# Patient Record
Sex: Female | Born: 1995 | Hispanic: No | Marital: Single
Health system: Northeastern US, Academic
[De-identification: ages and names within clinical notes are randomized; demographics above are authoritative.]

---
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
## 2022-08-27 ENCOUNTER — Ambulatory Visit: Admit: 2022-08-27 | Payer: MEDICAID | Attending: Dermatology | Primary: Internal Medicine

## 2022-08-27 DIAGNOSIS — L219 Seborrheic dermatitis, unspecified: Secondary | ICD-10-CM

## 2022-08-27 MED ORDER — HYDROCORTISONE 2.5 % TOPICAL CREAM
2.5 % | Freq: Two times a day (BID) | TOPICAL | 1 refills | Status: AC
Start: 2022-08-27 — End: ?

## 2022-08-27 MED ORDER — CLINDAMYCIN PHOSPHATE 1 % TOPICAL SWAB
1 % | Freq: Two times a day (BID) | TOPICAL | 5 refills | Status: AC
Start: 2022-08-27 — End: ?

## 2022-08-27 MED ORDER — KETOCONAZOLE 2 % TOPICAL CREAM
2 % | Freq: Every day | TOPICAL | 3 refills | Status: AC
Start: 2022-08-27 — End: ?

## 2022-08-27 NOTE — Progress Notes
She c/o lesions on the left buttock that she treated with aldara in the past. She has 5 white scars there now. She will use silicone scar gel.She c/o a dry rash on her face. 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 She will use Hydrocortisone 2.5% cream bid for 5 days then ketoconzaole cream to prevent. For acne on the thighs whe will use clindamycin wipes

## 2022-09-04 ENCOUNTER — Inpatient Hospital Stay: Admit: 2022-09-04 | Discharge: 2022-09-04 | Payer: MEDICAID | Attending: Emergency Medicine

## 2022-09-04 ENCOUNTER — Emergency Department: Admit: 2022-09-04 | Payer: MEDICAID | Primary: Internal Medicine

## 2022-09-04 DIAGNOSIS — R112 Nausea with vomiting, unspecified: Secondary | ICD-10-CM

## 2022-09-04 DIAGNOSIS — R1030 Lower abdominal pain, unspecified: Secondary | ICD-10-CM

## 2022-09-04 LAB — URINALYSIS WITH CULTURE REFLEX      (BH LMW YH)
BKR BILIRUBIN, UA: NEGATIVE
BKR GLUCOSE, UA: NEGATIVE
BKR KETONES, UA: NEGATIVE
BKR NITRITE, UA: NEGATIVE
BKR PH, UA: 6.5 (ref 5.5–7.5)
BKR SPECIFIC GRAVITY, UA: 1.021 (ref 1.005–1.030)
BKR UROBILINOGEN, UA (MG/DL): <2 mg/dL (ref <=2.0)

## 2022-09-04 LAB — COMPREHENSIVE METABOLIC PANEL
BKR A/G RATIO: 1.5
BKR ALANINE AMINOTRANSFERASE (ALT): 21 U/L (ref 12–78)
BKR ALBUMIN: 4.9 g/dL (ref 3.4–5.0)
BKR ALKALINE PHOSPHATASE: 83 U/L (ref 20–120)
BKR ANION GAP: 7 (ref 5–18)
BKR ASPARTATE AMINOTRANSFERASE (AST): 10 U/L (ref 5–37)
BKR AST/ALT RATIO: 0.5
BKR BILIRUBIN TOTAL: 0.8 mg/dL (ref 0.0–1.0)
BKR BLOOD UREA NITROGEN: 7 mg/dL — ABNORMAL LOW (ref 8–25)
BKR BUN / CREAT RATIO: 9.2 (ref 8.0–25.0)
BKR CALCIUM: 9.7 mg/dL (ref 8.4–10.3)
BKR CHLORIDE: 109 mmol/L (ref 95–115)
BKR CO2: 24 mmol/L (ref 21–32)
BKR CREATININE: 0.76 mg/dL (ref 0.50–1.30)
BKR EGFR, CREATININE (CKD-EPI 2021): >60 mL/min/{1.73_m2} (ref >=60)
BKR GLOBULIN: 3.3 g/dL
BKR GLUCOSE: 87 mg/dL (ref 70–100)
BKR OSMOLALITY CALCULATION: 277 mosm/kg (ref 275–295)
BKR POTASSIUM: 4.1 mmol/L (ref 3.5–5.1)
BKR PROTEIN TOTAL: 8.2 g/dL (ref 6.4–8.2)
BKR SODIUM: 140 mmol/L (ref 136–145)

## 2022-09-04 LAB — CBC WITH AUTO DIFFERENTIAL
BKR WAM ABSOLUTE IMMATURE GRANULOCYTES.: 0.04 x 1000/ÂµL (ref 0.00–0.30)
BKR WAM ABSOLUTE LYMPHOCYTE COUNT.: 4.79 x 1000/ÂµL — ABNORMAL HIGH (ref 0.60–3.70)
BKR WAM ABSOLUTE NRBC (2 DEC): 0 x 1000/ÂµL (ref 0.00–1.00)
BKR WAM ANALYZER ANC: 7.34 x 1000/ÂµL (ref 2.00–7.60)
BKR WAM BASOPHIL ABSOLUTE COUNT.: 0.03 x 1000/ÂµL (ref 0.00–1.00)
BKR WAM BASOPHILS: 0.2 % (ref 0.0–1.4)
BKR WAM EOSINOPHIL ABSOLUTE COUNT.: 0.31 x 1000/ÂµL (ref 0.00–1.00)
BKR WAM EOSINOPHILS: 2.3 % (ref 0.0–5.0)
BKR WAM HEMATOCRIT (2 DEC): 42.2 % (ref 35.00–45.00)
BKR WAM HEMOGLOBIN: 14.4 g/dL (ref 11.7–15.5)
BKR WAM IMMATURE GRANULOCYTES: 0.3 % (ref 0.0–1.0)
BKR WAM LYMPHOCYTES: 36.1 % (ref 17.0–50.0)
BKR WAM MCH (PG): 31.2 pg (ref 27.0–33.0)
BKR WAM MCHC: 34.1 g/dL (ref 31.0–36.0)
BKR WAM MCV: 91.3 fL (ref 80.0–100.0)
BKR WAM MONOCYTE ABSOLUTE COUNT.: 0.76 x 1000/ÂµL (ref 0.00–1.00)
BKR WAM MONOCYTES: 5.7 % (ref 4.0–12.0)
BKR WAM MPV: 11.2 fL (ref 8.0–12.0)
BKR WAM NEUTROPHILS: 55.4 % (ref 39.0–72.0)
BKR WAM NUCLEATED RED BLOOD CELLS: 0 % (ref 0.0–1.0)
BKR WAM PLATELETS: 286 x1000/ÂµL (ref 150–420)
BKR WAM RDW-CV: 11.9 % (ref 11.0–15.0)
BKR WAM RED BLOOD CELL COUNT.: 4.62 M/ÂµL (ref 4.00–6.00)
BKR WAM WHITE BLOOD CELL COUNT: 13.3 x1000/ÂµL — ABNORMAL HIGH (ref 4.0–11.0)

## 2022-09-04 LAB — UA REFLEX CULTURE

## 2022-09-04 LAB — URINE MICROSCOPIC     (BH GH LMW YH)
BKR RBC/HPF INSTRUMENT: 6 /HPF — ABNORMAL HIGH (ref 0–2)
BKR URINE SQUAMOUS EPITHELIAL CELLS, UA (NUMERIC): 6 /HPF — ABNORMAL HIGH (ref 0–5)
BKR WBC/HPF INSTRUMENT: 6 /HPF — ABNORMAL HIGH (ref 0–5)

## 2022-09-04 LAB — HCG, SERUM, QUALITATIVE (BH GH L LMW): BKR SERUM PREGNANCY-QUALITATIVE: NEGATIVE

## 2022-09-04 LAB — LIPASE: BKR LIPASE: 106 U/L (ref 73–393)

## 2022-09-04 MED ORDER — KETOROLAC 30 MG/ML (1 ML) INJECTION SOLUTION
30 mg/mL (1 mL) | Freq: Once | INTRAVENOUS | Status: CP
Start: 2022-09-04 — End: ?
  Administered 2022-09-04: 21:00:00 30 mL via INTRAVENOUS

## 2022-09-04 MED ORDER — IOHEXOL 350 MG IODINE/ML INTRAVENOUS SOLUTION
350 mg iodine/mL | Freq: Once | INTRAVENOUS | Status: CP | PRN
Start: 2022-09-04 — End: ?
  Administered 2022-09-04: 350 mL via INTRAVENOUS

## 2022-09-04 MED ORDER — OXYCODONE IMMEDIATE RELEASE 5 MG TABLET
5 mg | Freq: Once | ORAL | Status: DC
Start: 2022-09-04 — End: 2022-09-05

## 2022-09-04 MED ORDER — OXYCODONE IMMEDIATE RELEASE 5 MG TABLET
5 mg | ORAL_TABLET | Freq: Three times a day (TID) | ORAL | 1 refills | Status: AC | PRN
Start: 2022-09-04 — End: ?

## 2022-09-04 NOTE — ED Notes
5:19 PM  - Oral contrast start time 1700

## 2022-09-05 DIAGNOSIS — D259 Leiomyoma of uterus, unspecified: Secondary | ICD-10-CM

## 2022-09-05 NOTE — ED Provider Notes
? 2014 CD-Notes ?  All Rights [facility] Emergency Department	Chief Complaint:Chief Complaint Patient presents with ? Abdominal Pain   BIBA from work. +hx of fibroids. Seen at [facility] Monday and given motrin. Today c/o abd pain. +1 X EMESIS NGE:[id number] Hannah Butler, 26 y.o., female, complains of abdominal pain.  The patient c/o suprapubic pain x 3 days PTA.  Pain is dull, moderate in severity and Associated with N/V.  She reports having same kind of pain for the past 4 years usually coinciding with her menses.  She says that she was seen [facility] 3 days ago for these same symptoms and diagnosed with uterine fibroids after pelvic ultrasound, given Motrin and discharged. She denies fever, chills, diarrhea, dysuria.  + menses currently.  Pain started three days agoThe pain is described as dullSeverity of the pain is moderate.Onset of pain was gradual.Pain is located in the suprapubicAggravating factors: unable to associate with any factorAssociated symptoms: N/V x 1Historian: patientReview of Systems:Constitution: 	Negative for fever, chills, weaknessENT:		Negative for sore throat, nasal congestionEYES:		Negative for vision changesCARDIO:	Negative for chest pain, palpitationsRESP:		Negative for cough, shortness of breathGI:		abdominal pain, nausea, vomiting, Denies: flank pain, diarrhea, black stool, blood in stoolGU:		vaginal bleeding, Denies: dysuria, frequency/urgency, hematuriaMUSC:		Negative for muscle, joint painSKIN:		Negative for rashNEURO:	Negative for headachePast Medical History:No past medical history on file.No past surgical history on file.Social History: Family History:No family history on file.Medications: Medication List  START taking these medications  oxyCODONE 5 mg Immediate Release tabletCommonly known as: ROXICODONETake 1 tablet (5 mg total) by mouth every 8 (eight) hours as needed for pain.  ASK your doctor about these medications  clindamycin 1 % SwabCommonly known as: Clindacin PApply topically 2 (two) times daily. On thighs to keep acne away hydrocortisone 2.5 % creamCommonly known as: HYTONEApply topically 2 (two) times daily. On facial rash for 5 days ketoconazole 2 % creamCommonly known as: NIZORALApply topically daily. On face for keep rash away   Where to Get Your Medications  These medications were sent to [id number], [place] - [address]  [address] [place], [place] [address]  Phone: [phone] ?	oxyCODONE 5 mg Immediate Release tablet Allergies as of 09/04/2022 ? (No Known Allergies) Immunizations:There is no immunization history on file for this patient. Physical Exam:  ED Triage Vitals [09/04/22 1453]BP: 106/69Pulse: (!) 56Pulse from  O2 sat: n/aResp: 16Temp: 97.8 ?F (36.6 ?C)Temp src: OralSpO2: 100 % General Appear:	alert, healthy, no distress, well nourished, well developed, comfortable and cooperativeEars/Nose/Throat:	mucosa moist, pharynx clear, no cervical nodesEyes:			anictericCardiovascular:	regular rate and rhythm, no murmurRespiratory:		clear to auscultation bilaterally without wheeze, rales or ronchiAbdomen:		+BS, soft.  Mild RLQ tenderness.  No R/R/G.  No CVATMusculoskeletal:	no joint tendernessSkin:			no rash, skin dryNeurologic:		alert and oriented x 3.  Normal gait. 	Medical Decision Making:Nursing notes were reviewed: YesI reviewed the following historical information: noneLaboratory studies: Yes Results for orders placed or performed during the hospital encounter of 09/04/22 Lipase Result Value Ref Range  Lipase 106 73 - 393 U/L hCG, serum, qualitative  (females 10-60 yrs old unless hysterectomy) Result Value Ref Range  Serum Pregnancy-Qualitative Negative Negative CBC auto differential Result Value Ref Range WBC 13.3 (H) 4.0 - 11.0 x1000/?L  RBC 4.62 4.00 - 6.00 M/?L  Hemoglobin 14.4 11.7 - 15.5 g/dL  Hematocrit 34.74 25.95 - 45.00 %  MCV 91.3 80.0 - 100.0 fL  MCH 31.2 27.0 - 33.0 pg  MCHC 34.1 31.0 - 36.0 g/dL  RDW-CV 63.8 75.6 - 43.3 %  Platelets 286 150 - 420 x1000/?L  MPV 11.2 8.0 - 12.0 fL  Neutrophils 55.4 39.0 -  72.0 %  Lymphocytes 36.1 17.0 - 50.0 %  Monocytes 5.7 4.0 - 12.0 %  Eosinophils 2.3 0.0 - 5.0 %  Basophil 0.2 0.0 - 1.4 %  Immature Granulocytes 0.3 0.0 - 1.0 %  nRBC 0.0 0.0 - 1.0 %  ANC(Abs Neutrophil Count) 7.34 2.00 - 7.60 x 1000/?L  Absolute Lymphocyte Count 4.79 (H) 0.60 - 3.70 x 1000/?L  Monocyte Absolute Count 0.76 0.00 - 1.00 x 1000/?L  Eosinophil Absolute Count 0.31 0.00 - 1.00 x 1000/?L  Basophil Absolute Count 0.03 0.00 - 1.00 x 1000/?L  Absolute Immature Granulocyte Count 0.04 0.00 - 0.30 x 1000/?L  Absolute nRBC 0.00 0.00 - 1.00 x 1000/?L Comprehensive metabolic panel Result Value Ref Range  Sodium 140 136 - 145 mmol/L  Potassium 4.1 3.5 - 5.1 mmol/L  Chloride 109 95 - 115 mmol/L  CO2 24 21 - 32 mmol/L  Anion Gap 7 5 - 18  Glucose 87 70 - 100 mg/dL  BUN 7 (L) 8 - 25 mg/dL  Creatinine 9.60 4.54 - 1.30 mg/dL  Calcium 9.7 8.4 - 09.8 mg/dL  BUN/Creatinine Ratio 9.2 8.0 - 25.0  Total Protein 8.2 6.4 - 8.2 g/dL  Albumin 4.9 3.4 - 5.0 g/dL  Total Bilirubin 0.8 0.0 - 1.0 mg/dL  Alkaline Phosphatase 83 20 - 120 U/L  Alanine Aminotransferase (ALT) 21 12 - 78 U/L  Aspartate Aminotransferase (AST) 10 5 - 37 U/L  Globulin 3.3 g/dL  A/G Ratio 1.5   AST/ALT Ratio 0.5 Reference Range Not Established  Osmolality Calculation 277 275 - 295 mOsm/kg  eGFR (Creatinine) >60 >=60 mL/min/1.71m2 UA reflex to culture  Specimen: Clean Catch (Voided); Urine Result Value Ref Range  Reflex Urine Culture See Comment  Urinalysis with culture reflex     (BH LMW YH)  Specimen: Clean Catch (Voided); Urine Result Value Ref Range  Clarity, UA Cloudy (A) Clear  Color, UA Yellow Yellow, Colorless  Specific Gravity, UA 1.021 1.005 - 1.030  pH, UA 6.5 5.5 - 7.5  Protein, UA Trace Negative, Trace  Glucose, UA Negative Negative  Ketones, UA Negative Negative  Blood, UA 3+ (A) Negative  Bilirubin, UA Negative Negative  Leukocytes, UA 1+ (A) Negative  Nitrite, UA Negative Negative  Urobilinogen, UA <2.0 <=2.0 mg/dL Urine microscopic     (BH GH LMW YH) Result Value Ref Range  RBC/HPF, UA 6 (H) 0 - 2 /HPF  WBC/HPF, UA 6 (H) 0 - 5 /HPF  Bacteria, UA Rare None-Rare /HPF  Urine Squamous Epithelial Cells, UA 6 (H) 0 - 5 /HPF Radiologic Imaging studies: YesCT Abdomen Pelvis w IV Contrast (Oral) Final Result 1. Normal appendix. No explanation for right lower quadrant pain. 2. Fibroid uterus.  [facility] Radiology Notify System Classification: Routine  Reported and signed by:  Lonia Chimera, MD   		Independently interpreted by ED provider: labsDiscussed patient with another provider: NoRepeat abdominal exam: Soft, mild suprapubic tenderness.  No R/R/G.  Summary of ED Vital Signs: Vitals:  09/04/22 1453 BP: 106/69 Pulse: (!) 56 Resp: 16 Temp: 97.8 ?F (36.6 ?C) TempSrc: Oral SpO2: 100%  ED Course/Assessment/Plan:Medical Decision MakingThe patient presents with c/o lower abdominal pain and nausea x 3 days PTA.  She reports fibroid uterus on recent pelvic US this week w/o other acute pelvic abnormalities.  VSSA.  Labs notable for Leukocytosis (13.3).  [place] abdomen pelvis performed at the patient demonstrated RLQ tenderness on initial physical exam.  Study demonstrated a fibroid uterus (largest measuring 2.5 cm) w/o other acute intraabdominal or pelvic pathology.  The patient is comfortable going home and will f/u with her gynecologist next week.  Given (# 8) 5 mg oxycodone to use for pain not improved after NSAIDs.Interval return precautions d/w the patient and all questions were addressed.I reviewed the results of the testing that was performed in the Emergency Department, with Ralene Cork.I discussed the discharge plan with the patient and the patient understand and agrees with the plan.  The patient understands that if their symptoms should change or worsen that they should return to the Emergency Department for re-evaluation.Nonspecific abdominal pain: acute illness or injuryUterine leiomyoma, unspecified location: chronic illness or injuryAmount and/or Complexity of Data ReviewedLabs: ordered. Decision-making details documented in ED Course.Radiology: ordered. Decision-making details documented in ED Course.RiskPrescription drug management.______________________________________________________________________Diagnosis: Uterine fibroids, nonspecific abdominal painDisposition: HomeCondition: Stable?2014 CD-Notes?  LLC All Rights Reserved; version 2.0; revised November, [email or web address] Laverle Hobby, DO11/09/23 1928

## 2022-09-05 NOTE — Telephone Encounter
OK as per patient who stated that she was seen by her GYN today.

## 2022-09-06 LAB — URINE CULTURE
